# Patient Record
Sex: Female | Born: 1950 | Race: White | Hispanic: No
Health system: Southern US, Community
[De-identification: ages and names within clinical notes are randomized; demographics above are authoritative.]

---

## 2002-08-27 ENCOUNTER — Encounter: Admission: RE | Admit: 2002-08-27 | Discharge: 2002-08-27 | Payer: Self-pay | Admitting: Family Medicine

## 2002-08-27 ENCOUNTER — Encounter: Payer: Self-pay | Admitting: Family Medicine

## 2002-12-25 ENCOUNTER — Ambulatory Visit (HOSPITAL_COMMUNITY): Admission: RE | Admit: 2002-12-25 | Discharge: 2002-12-25 | Payer: Self-pay | Admitting: Gastroenterology

## 2003-11-25 ENCOUNTER — Other Ambulatory Visit: Admission: RE | Admit: 2003-11-25 | Discharge: 2003-11-25 | Payer: Self-pay | Admitting: Family Medicine

## 2004-04-27 ENCOUNTER — Encounter: Admission: RE | Admit: 2004-04-27 | Discharge: 2004-04-27 | Payer: Self-pay | Admitting: Family Medicine

## 2005-02-26 ENCOUNTER — Other Ambulatory Visit: Admission: RE | Admit: 2005-02-26 | Discharge: 2005-02-26 | Payer: Self-pay | Admitting: Family Medicine

## 2005-05-30 ENCOUNTER — Encounter: Admission: RE | Admit: 2005-05-30 | Discharge: 2005-05-30 | Payer: Self-pay | Admitting: Family Medicine

## 2006-04-22 ENCOUNTER — Other Ambulatory Visit: Admission: RE | Admit: 2006-04-22 | Discharge: 2006-04-22 | Payer: Self-pay | Admitting: Family Medicine

## 2006-06-03 ENCOUNTER — Encounter: Admission: RE | Admit: 2006-06-03 | Discharge: 2006-06-03 | Payer: Self-pay | Admitting: Family Medicine

## 2007-04-23 ENCOUNTER — Other Ambulatory Visit: Admission: RE | Admit: 2007-04-23 | Discharge: 2007-04-23 | Payer: Self-pay | Admitting: Family Medicine

## 2007-10-20 ENCOUNTER — Encounter: Admission: RE | Admit: 2007-10-20 | Discharge: 2007-10-20 | Payer: Self-pay | Admitting: Family Medicine

## 2008-11-29 ENCOUNTER — Encounter: Admission: RE | Admit: 2008-11-29 | Discharge: 2008-11-29 | Payer: Self-pay | Admitting: Family Medicine

## 2008-12-22 ENCOUNTER — Other Ambulatory Visit: Admission: RE | Admit: 2008-12-22 | Discharge: 2008-12-22 | Payer: Self-pay | Admitting: Family Medicine

## 2009-12-27 ENCOUNTER — Other Ambulatory Visit
Admission: RE | Admit: 2009-12-27 | Discharge: 2009-12-27 | Payer: Self-pay | Source: Home / Self Care | Admitting: Family Medicine

## 2010-01-05 ENCOUNTER — Encounter: Admission: RE | Admit: 2010-01-05 | Discharge: 2010-01-05 | Payer: Self-pay | Admitting: Family Medicine

## 2011-02-07 ENCOUNTER — Other Ambulatory Visit: Payer: Self-pay | Admitting: Family Medicine

## 2011-02-07 DIAGNOSIS — Z1231 Encounter for screening mammogram for malignant neoplasm of breast: Secondary | ICD-10-CM

## 2011-02-22 ENCOUNTER — Ambulatory Visit
Admission: RE | Admit: 2011-02-22 | Discharge: 2011-02-22 | Disposition: A | Discharge status: Home / Self Care | Payer: BC Managed Care – PPO | Source: Ambulatory Visit | Attending: Family Medicine | Admitting: Family Medicine

## 2011-02-22 DIAGNOSIS — Z1231 Encounter for screening mammogram for malignant neoplasm of breast: Secondary | ICD-10-CM

## 2012-06-17 ENCOUNTER — Other Ambulatory Visit: Payer: Self-pay

## 2012-06-17 DIAGNOSIS — Z1231 Encounter for screening mammogram for malignant neoplasm of breast: Secondary | ICD-10-CM

## 2012-07-18 ENCOUNTER — Ambulatory Visit
Admission: RE | Admit: 2012-07-18 | Discharge: 2012-07-18 | Disposition: A | Discharge status: Home / Self Care | Payer: BC Managed Care – PPO | Source: Ambulatory Visit

## 2012-07-18 DIAGNOSIS — Z1231 Encounter for screening mammogram for malignant neoplasm of breast: Secondary | ICD-10-CM

## 2013-03-16 ENCOUNTER — Other Ambulatory Visit: Payer: Self-pay | Admitting: Family Medicine

## 2013-03-16 ENCOUNTER — Other Ambulatory Visit (HOSPITAL_COMMUNITY)
Admission: RE | Admit: 2013-03-16 | Discharge: 2013-03-16 | Disposition: A | Discharge status: Home / Self Care | Payer: BC Managed Care – PPO | Source: Ambulatory Visit | Attending: Family Medicine | Admitting: Family Medicine

## 2013-03-16 DIAGNOSIS — Z124 Encounter for screening for malignant neoplasm of cervix: Secondary | ICD-10-CM | POA: Insufficient documentation

## 2013-08-24 ENCOUNTER — Other Ambulatory Visit: Payer: Self-pay

## 2013-08-24 DIAGNOSIS — Z1231 Encounter for screening mammogram for malignant neoplasm of breast: Secondary | ICD-10-CM

## 2013-08-28 ENCOUNTER — Ambulatory Visit: Payer: BC Managed Care – PPO

## 2013-09-01 ENCOUNTER — Ambulatory Visit
Admission: RE | Admit: 2013-09-01 | Discharge: 2013-09-01 | Disposition: A | Discharge status: Home / Self Care | Payer: BC Managed Care – PPO | Source: Ambulatory Visit

## 2013-09-01 DIAGNOSIS — Z1231 Encounter for screening mammogram for malignant neoplasm of breast: Secondary | ICD-10-CM

## 2014-08-16 ENCOUNTER — Other Ambulatory Visit: Payer: Self-pay

## 2014-08-16 DIAGNOSIS — Z1231 Encounter for screening mammogram for malignant neoplasm of breast: Secondary | ICD-10-CM

## 2014-09-03 ENCOUNTER — Ambulatory Visit
Admission: RE | Admit: 2014-09-03 | Discharge: 2014-09-03 | Disposition: A | Discharge status: Home / Self Care | Payer: BC Managed Care – PPO | Source: Ambulatory Visit

## 2014-09-03 DIAGNOSIS — Z1231 Encounter for screening mammogram for malignant neoplasm of breast: Secondary | ICD-10-CM

## 2015-09-26 ENCOUNTER — Other Ambulatory Visit: Payer: Self-pay | Admitting: Family Medicine

## 2015-09-26 DIAGNOSIS — Z1231 Encounter for screening mammogram for malignant neoplasm of breast: Secondary | ICD-10-CM

## 2015-09-30 ENCOUNTER — Ambulatory Visit
Admission: RE | Admit: 2015-09-30 | Discharge: 2015-09-30 | Disposition: A | Discharge status: Home / Self Care | Payer: BC Managed Care – PPO | Source: Ambulatory Visit | Attending: Family Medicine | Admitting: Family Medicine

## 2015-09-30 DIAGNOSIS — Z1231 Encounter for screening mammogram for malignant neoplasm of breast: Secondary | ICD-10-CM

## 2016-11-02 ENCOUNTER — Other Ambulatory Visit: Payer: Self-pay | Admitting: Family Medicine

## 2016-11-02 ENCOUNTER — Other Ambulatory Visit (HOSPITAL_COMMUNITY)
Admission: RE | Admit: 2016-11-02 | Discharge: 2016-11-02 | Disposition: A | Discharge status: Home / Self Care | Payer: BC Managed Care – PPO | Source: Ambulatory Visit | Attending: Family Medicine | Admitting: Family Medicine

## 2016-11-02 DIAGNOSIS — Z124 Encounter for screening for malignant neoplasm of cervix: Secondary | ICD-10-CM | POA: Diagnosis not present

## 2016-11-06 LAB — CYTOLOGY - PAP
Diagnosis: NEGATIVE
HPV: NOT DETECTED

## 2016-11-20 ENCOUNTER — Other Ambulatory Visit: Payer: Self-pay | Admitting: Family Medicine

## 2016-11-20 DIAGNOSIS — Z1231 Encounter for screening mammogram for malignant neoplasm of breast: Secondary | ICD-10-CM

## 2016-12-17 ENCOUNTER — Ambulatory Visit
Admission: RE | Admit: 2016-12-17 | Discharge: 2016-12-17 | Disposition: A | Discharge status: Home / Self Care | Payer: BC Managed Care – PPO | Source: Ambulatory Visit | Attending: Family Medicine | Admitting: Family Medicine

## 2016-12-17 ENCOUNTER — Encounter (INDEPENDENT_AMBULATORY_CARE_PROVIDER_SITE_OTHER): Payer: Self-pay

## 2016-12-17 DIAGNOSIS — Z1231 Encounter for screening mammogram for malignant neoplasm of breast: Secondary | ICD-10-CM

## 2018-03-07 ENCOUNTER — Other Ambulatory Visit: Payer: Self-pay | Admitting: Family Medicine

## 2018-03-07 DIAGNOSIS — M810 Age-related osteoporosis without current pathological fracture: Secondary | ICD-10-CM

## 2018-03-10 ENCOUNTER — Other Ambulatory Visit: Payer: Self-pay | Admitting: Family Medicine

## 2018-03-10 DIAGNOSIS — M810 Age-related osteoporosis without current pathological fracture: Secondary | ICD-10-CM

## 2018-03-25 ENCOUNTER — Other Ambulatory Visit: Payer: Self-pay | Admitting: Family Medicine

## 2018-03-25 DIAGNOSIS — Z1231 Encounter for screening mammogram for malignant neoplasm of breast: Secondary | ICD-10-CM

## 2018-03-28 ENCOUNTER — Ambulatory Visit
Admission: RE | Admit: 2018-03-28 | Discharge: 2018-03-28 | Disposition: A | Discharge status: Home / Self Care | Payer: BC Managed Care – PPO | Source: Ambulatory Visit

## 2018-03-28 DIAGNOSIS — Z1231 Encounter for screening mammogram for malignant neoplasm of breast: Secondary | ICD-10-CM

## 2019-01-21 ENCOUNTER — Ambulatory Visit
Admission: RE | Admit: 2019-01-21 | Discharge: 2019-01-21 | Disposition: A | Discharge status: Home / Self Care | Payer: Medicare Other | Source: Ambulatory Visit | Attending: Family Medicine | Admitting: Family Medicine

## 2019-01-21 ENCOUNTER — Other Ambulatory Visit: Payer: Self-pay

## 2019-01-21 DIAGNOSIS — M810 Age-related osteoporosis without current pathological fracture: Secondary | ICD-10-CM

## 2019-03-07 ENCOUNTER — Ambulatory Visit: Payer: BC Managed Care – PPO

## 2019-03-12 ENCOUNTER — Ambulatory Visit: Payer: BC Managed Care – PPO

## 2019-03-15 ENCOUNTER — Ambulatory Visit: Payer: Medicare PPO | Attending: Internal Medicine

## 2019-03-15 DIAGNOSIS — Z23 Encounter for immunization: Secondary | ICD-10-CM | POA: Insufficient documentation

## 2019-03-15 NOTE — Progress Notes (Signed)
   Covid-19 Vaccination Clinic  Name:  Lynn Mcknight    MRN: [id number] DOB: 08/18/1950  03/15/2019  [name] was observed post Covid-19 immunization for 15 minutes without incidence. She was provided with Vaccine Information Sheet and instruction to access the V-Safe system.   [name] was instructed to call 911 with any severe reactions post vaccine: Marland Kitchen Difficulty breathing  . Swelling of your face and throat  . A fast heartbeat  . A bad rash all over your body  . Dizziness and weakness    Immunizations Administered    Name Date Dose VIS Date Route   Pfizer COVID-19 Vaccine 03/15/2019  8:29 AM 0.3 mL 01/16/2019 Intramuscular   Manufacturer: [employer], [employer]   Lot: [id number]   NDC: [address]-6

## 2019-04-08 ENCOUNTER — Ambulatory Visit: Payer: Medicare PPO | Attending: Internal Medicine

## 2019-04-08 DIAGNOSIS — Z23 Encounter for immunization: Secondary | ICD-10-CM

## 2019-04-08 NOTE — Progress Notes (Signed)
   Covid-19 Vaccination Clinic  Name:  Lynn Mcknight    MRN: [id number] DOB: Aug 10, 1950  04/08/2019  [name] was observed post Covid-19 immunization for 15 minutes without incident. She was provided with Vaccine Information Sheet and instruction to access the V-Safe system.   [name] was instructed to call 911 with any severe reactions post vaccine: Marland Kitchen Difficulty breathing  . Swelling of face and throat  . A fast heartbeat  . A bad rash all over body  . Dizziness and weakness   Immunizations Administered    Name Date Dose VIS Date Route   Pfizer COVID-19 Vaccine 04/08/2019  3:34 PM 0.3 mL 01/16/2019 Intramuscular   Manufacturer: [employer], [employer]   Lot: [id number]   NDC: [address]-7

## 2019-05-07 DIAGNOSIS — F4323 Adjustment disorder with mixed anxiety and depressed mood: Secondary | ICD-10-CM | POA: Diagnosis not present

## 2019-05-19 DIAGNOSIS — F4323 Adjustment disorder with mixed anxiety and depressed mood: Secondary | ICD-10-CM | POA: Diagnosis not present

## 2019-05-25 ENCOUNTER — Other Ambulatory Visit: Payer: Self-pay | Admitting: Family Medicine

## 2019-05-25 DIAGNOSIS — Z1231 Encounter for screening mammogram for malignant neoplasm of breast: Secondary | ICD-10-CM

## 2019-05-27 ENCOUNTER — Ambulatory Visit
Admission: RE | Admit: 2019-05-27 | Discharge: 2019-05-27 | Disposition: A | Discharge status: Home / Self Care | Payer: Medicare PPO | Source: Ambulatory Visit

## 2019-05-27 ENCOUNTER — Other Ambulatory Visit: Payer: Self-pay

## 2019-05-27 DIAGNOSIS — Z1231 Encounter for screening mammogram for malignant neoplasm of breast: Secondary | ICD-10-CM | POA: Diagnosis not present

## 2019-06-08 DIAGNOSIS — M810 Age-related osteoporosis without current pathological fracture: Secondary | ICD-10-CM | POA: Diagnosis not present

## 2019-06-08 DIAGNOSIS — F4323 Adjustment disorder with mixed anxiety and depressed mood: Secondary | ICD-10-CM | POA: Diagnosis not present

## 2019-06-08 DIAGNOSIS — Z23 Encounter for immunization: Secondary | ICD-10-CM | POA: Diagnosis not present

## 2019-06-22 DIAGNOSIS — F4323 Adjustment disorder with mixed anxiety and depressed mood: Secondary | ICD-10-CM | POA: Diagnosis not present

## 2019-07-09 DIAGNOSIS — F4323 Adjustment disorder with mixed anxiety and depressed mood: Secondary | ICD-10-CM | POA: Diagnosis not present

## 2019-07-15 DIAGNOSIS — F4323 Adjustment disorder with mixed anxiety and depressed mood: Secondary | ICD-10-CM | POA: Diagnosis not present

## 2019-07-29 DIAGNOSIS — K648 Other hemorrhoids: Secondary | ICD-10-CM | POA: Diagnosis not present

## 2019-08-06 DIAGNOSIS — F4323 Adjustment disorder with mixed anxiety and depressed mood: Secondary | ICD-10-CM | POA: Diagnosis not present

## 2019-08-12 DIAGNOSIS — K648 Other hemorrhoids: Secondary | ICD-10-CM | POA: Diagnosis not present

## 2019-08-13 DIAGNOSIS — F4323 Adjustment disorder with mixed anxiety and depressed mood: Secondary | ICD-10-CM | POA: Diagnosis not present

## 2019-08-20 DIAGNOSIS — F4323 Adjustment disorder with mixed anxiety and depressed mood: Secondary | ICD-10-CM | POA: Diagnosis not present

## 2019-08-26 DIAGNOSIS — K648 Other hemorrhoids: Secondary | ICD-10-CM | POA: Diagnosis not present

## 2019-12-10 DIAGNOSIS — M810 Age-related osteoporosis without current pathological fracture: Secondary | ICD-10-CM | POA: Diagnosis not present

## 2020-01-06 DIAGNOSIS — F4323 Adjustment disorder with mixed anxiety and depressed mood: Secondary | ICD-10-CM | POA: Diagnosis not present

## 2020-01-12 DIAGNOSIS — H5203 Hypermetropia, bilateral: Secondary | ICD-10-CM | POA: Diagnosis not present

## 2020-01-12 DIAGNOSIS — H2513 Age-related nuclear cataract, bilateral: Secondary | ICD-10-CM | POA: Diagnosis not present

## 2020-01-12 DIAGNOSIS — H52223 Regular astigmatism, bilateral: Secondary | ICD-10-CM | POA: Diagnosis not present

## 2020-01-12 DIAGNOSIS — H524 Presbyopia: Secondary | ICD-10-CM | POA: Diagnosis not present

## 2020-01-18 DIAGNOSIS — F4323 Adjustment disorder with mixed anxiety and depressed mood: Secondary | ICD-10-CM | POA: Diagnosis not present

## 2020-02-08 DIAGNOSIS — F4323 Adjustment disorder with mixed anxiety and depressed mood: Secondary | ICD-10-CM | POA: Diagnosis not present

## 2020-02-15 DIAGNOSIS — F4323 Adjustment disorder with mixed anxiety and depressed mood: Secondary | ICD-10-CM | POA: Diagnosis not present

## 2020-02-17 DIAGNOSIS — F39 Unspecified mood [affective] disorder: Secondary | ICD-10-CM | POA: Diagnosis not present

## 2020-02-17 DIAGNOSIS — F418 Other specified anxiety disorders: Secondary | ICD-10-CM | POA: Diagnosis not present

## 2020-02-25 DIAGNOSIS — F4323 Adjustment disorder with mixed anxiety and depressed mood: Secondary | ICD-10-CM | POA: Diagnosis not present

## 2020-02-29 DIAGNOSIS — F4323 Adjustment disorder with mixed anxiety and depressed mood: Secondary | ICD-10-CM | POA: Diagnosis not present

## 2020-03-08 DIAGNOSIS — F4323 Adjustment disorder with mixed anxiety and depressed mood: Secondary | ICD-10-CM | POA: Diagnosis not present

## 2020-03-10 ENCOUNTER — Other Ambulatory Visit: Payer: Self-pay | Admitting: Family Medicine

## 2020-03-10 DIAGNOSIS — Z0000 Encounter for general adult medical examination without abnormal findings: Secondary | ICD-10-CM | POA: Diagnosis not present

## 2020-03-10 DIAGNOSIS — E559 Vitamin D deficiency, unspecified: Secondary | ICD-10-CM | POA: Diagnosis not present

## 2020-03-10 DIAGNOSIS — K219 Gastro-esophageal reflux disease without esophagitis: Secondary | ICD-10-CM | POA: Diagnosis not present

## 2020-03-10 DIAGNOSIS — F39 Unspecified mood [affective] disorder: Secondary | ICD-10-CM | POA: Diagnosis not present

## 2020-03-10 DIAGNOSIS — M810 Age-related osteoporosis without current pathological fracture: Secondary | ICD-10-CM | POA: Diagnosis not present

## 2020-03-10 DIAGNOSIS — F418 Other specified anxiety disorders: Secondary | ICD-10-CM | POA: Diagnosis not present

## 2020-03-10 DIAGNOSIS — E785 Hyperlipidemia, unspecified: Secondary | ICD-10-CM | POA: Diagnosis not present

## 2020-03-10 DIAGNOSIS — M1990 Unspecified osteoarthritis, unspecified site: Secondary | ICD-10-CM | POA: Diagnosis not present

## 2020-04-06 DIAGNOSIS — F4323 Adjustment disorder with mixed anxiety and depressed mood: Secondary | ICD-10-CM | POA: Diagnosis not present

## 2020-04-18 DIAGNOSIS — F4323 Adjustment disorder with mixed anxiety and depressed mood: Secondary | ICD-10-CM | POA: Diagnosis not present

## 2020-04-27 DIAGNOSIS — F4323 Adjustment disorder with mixed anxiety and depressed mood: Secondary | ICD-10-CM | POA: Diagnosis not present

## 2020-05-04 DIAGNOSIS — F4323 Adjustment disorder with mixed anxiety and depressed mood: Secondary | ICD-10-CM | POA: Diagnosis not present

## 2020-06-13 DIAGNOSIS — M810 Age-related osteoporosis without current pathological fracture: Secondary | ICD-10-CM | POA: Diagnosis not present

## 2020-06-15 DIAGNOSIS — F4323 Adjustment disorder with mixed anxiety and depressed mood: Secondary | ICD-10-CM | POA: Diagnosis not present

## 2020-06-22 DIAGNOSIS — F4323 Adjustment disorder with mixed anxiety and depressed mood: Secondary | ICD-10-CM | POA: Diagnosis not present

## 2020-06-29 DIAGNOSIS — F4323 Adjustment disorder with mixed anxiety and depressed mood: Secondary | ICD-10-CM | POA: Diagnosis not present

## 2020-07-06 DIAGNOSIS — F4323 Adjustment disorder with mixed anxiety and depressed mood: Secondary | ICD-10-CM | POA: Diagnosis not present

## 2020-07-11 ENCOUNTER — Other Ambulatory Visit (HOSPITAL_BASED_OUTPATIENT_CLINIC_OR_DEPARTMENT_OTHER): Payer: Self-pay | Admitting: Family Medicine

## 2020-07-11 DIAGNOSIS — Z1231 Encounter for screening mammogram for malignant neoplasm of breast: Secondary | ICD-10-CM

## 2020-07-12 ENCOUNTER — Ambulatory Visit (HOSPITAL_BASED_OUTPATIENT_CLINIC_OR_DEPARTMENT_OTHER)
Admission: RE | Admit: 2020-07-12 | Discharge: 2020-07-12 | Disposition: A | Discharge status: Home / Self Care | Payer: Medicare PPO | Source: Ambulatory Visit | Attending: Family Medicine | Admitting: Family Medicine

## 2020-07-12 ENCOUNTER — Encounter (HOSPITAL_BASED_OUTPATIENT_CLINIC_OR_DEPARTMENT_OTHER): Payer: Self-pay

## 2020-07-12 ENCOUNTER — Other Ambulatory Visit: Payer: Self-pay

## 2020-07-12 DIAGNOSIS — Z1231 Encounter for screening mammogram for malignant neoplasm of breast: Secondary | ICD-10-CM | POA: Diagnosis not present

## 2020-07-13 DIAGNOSIS — F4323 Adjustment disorder with mixed anxiety and depressed mood: Secondary | ICD-10-CM | POA: Diagnosis not present

## 2020-07-20 DIAGNOSIS — F4323 Adjustment disorder with mixed anxiety and depressed mood: Secondary | ICD-10-CM | POA: Diagnosis not present

## 2020-08-17 DIAGNOSIS — F4323 Adjustment disorder with mixed anxiety and depressed mood: Secondary | ICD-10-CM | POA: Diagnosis not present

## 2020-10-18 DIAGNOSIS — R059 Cough, unspecified: Secondary | ICD-10-CM | POA: Diagnosis not present

## 2020-10-18 DIAGNOSIS — U071 COVID-19: Secondary | ICD-10-CM | POA: Diagnosis not present

## 2020-10-18 DIAGNOSIS — R0981 Nasal congestion: Secondary | ICD-10-CM | POA: Diagnosis not present

## 2020-12-15 DIAGNOSIS — M810 Age-related osteoporosis without current pathological fracture: Secondary | ICD-10-CM | POA: Diagnosis not present

## 2021-01-23 ENCOUNTER — Other Ambulatory Visit: Payer: Self-pay | Admitting: Family Medicine

## 2021-01-23 ENCOUNTER — Other Ambulatory Visit: Payer: Medicare PPO

## 2021-01-23 DIAGNOSIS — M810 Age-related osteoporosis without current pathological fracture: Secondary | ICD-10-CM

## 2021-06-19 ENCOUNTER — Other Ambulatory Visit: Payer: Medicare PPO

## 2021-07-20 ENCOUNTER — Other Ambulatory Visit: Payer: Self-pay | Admitting: Family Medicine

## 2021-07-20 DIAGNOSIS — F39 Unspecified mood [affective] disorder: Secondary | ICD-10-CM | POA: Diagnosis not present

## 2021-07-20 DIAGNOSIS — M810 Age-related osteoporosis without current pathological fracture: Secondary | ICD-10-CM | POA: Diagnosis not present

## 2021-07-20 DIAGNOSIS — Z0000 Encounter for general adult medical examination without abnormal findings: Secondary | ICD-10-CM | POA: Diagnosis not present

## 2021-07-20 DIAGNOSIS — N951 Menopausal and female climacteric states: Secondary | ICD-10-CM | POA: Diagnosis not present

## 2021-07-20 DIAGNOSIS — Z1331 Encounter for screening for depression: Secondary | ICD-10-CM | POA: Diagnosis not present

## 2021-07-20 DIAGNOSIS — Z1231 Encounter for screening mammogram for malignant neoplasm of breast: Secondary | ICD-10-CM

## 2021-07-20 DIAGNOSIS — M1990 Unspecified osteoarthritis, unspecified site: Secondary | ICD-10-CM | POA: Diagnosis not present

## 2021-07-20 DIAGNOSIS — E785 Hyperlipidemia, unspecified: Secondary | ICD-10-CM | POA: Diagnosis not present

## 2021-07-20 DIAGNOSIS — E559 Vitamin D deficiency, unspecified: Secondary | ICD-10-CM | POA: Diagnosis not present

## 2021-07-20 DIAGNOSIS — K219 Gastro-esophageal reflux disease without esophagitis: Secondary | ICD-10-CM | POA: Diagnosis not present

## 2021-08-29 ENCOUNTER — Ambulatory Visit
Admission: RE | Admit: 2021-08-29 | Discharge: 2021-08-29 | Disposition: A | Discharge status: Home / Self Care | Payer: Medicare PPO | Source: Ambulatory Visit | Attending: Family Medicine | Admitting: Family Medicine

## 2021-08-29 DIAGNOSIS — Z780 Asymptomatic menopausal state: Secondary | ICD-10-CM | POA: Diagnosis not present

## 2021-08-29 DIAGNOSIS — Z1231 Encounter for screening mammogram for malignant neoplasm of breast: Secondary | ICD-10-CM

## 2021-08-29 DIAGNOSIS — M810 Age-related osteoporosis without current pathological fracture: Secondary | ICD-10-CM

## 2021-08-29 DIAGNOSIS — M8589 Other specified disorders of bone density and structure, multiple sites: Secondary | ICD-10-CM | POA: Diagnosis not present

## 2021-12-05 IMAGING — MG MM DIGITAL SCREENING BILAT W/ TOMO AND CAD
8 series · 9 of 24 positions shown · non-contrast
Comparison: Previous exam(s).

CLINICAL DATA: Screening.

EXAM:
DIGITAL SCREENING BILATERAL MAMMOGRAM WITH TOMOSYNTHESIS AND CAD
TECHNIQUE: Bilateral screening digital craniocaudal and mediolateral oblique
mammograms were obtained. Bilateral screening digital breast
tomosynthesis was performed. The images were evaluated with
computer-aided detection.

[L MLO synth-2D]
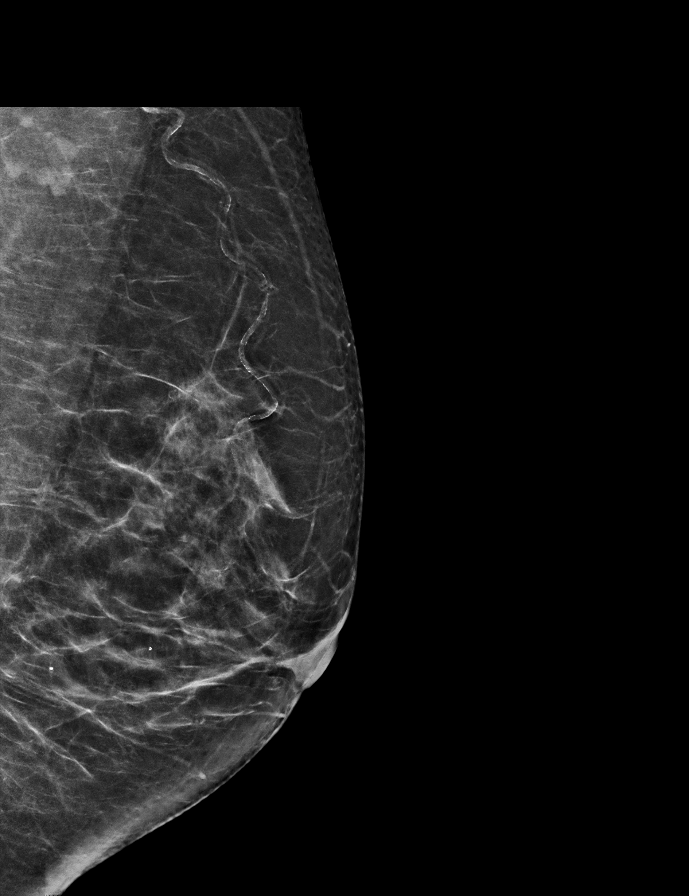

[L CC synth-2D]
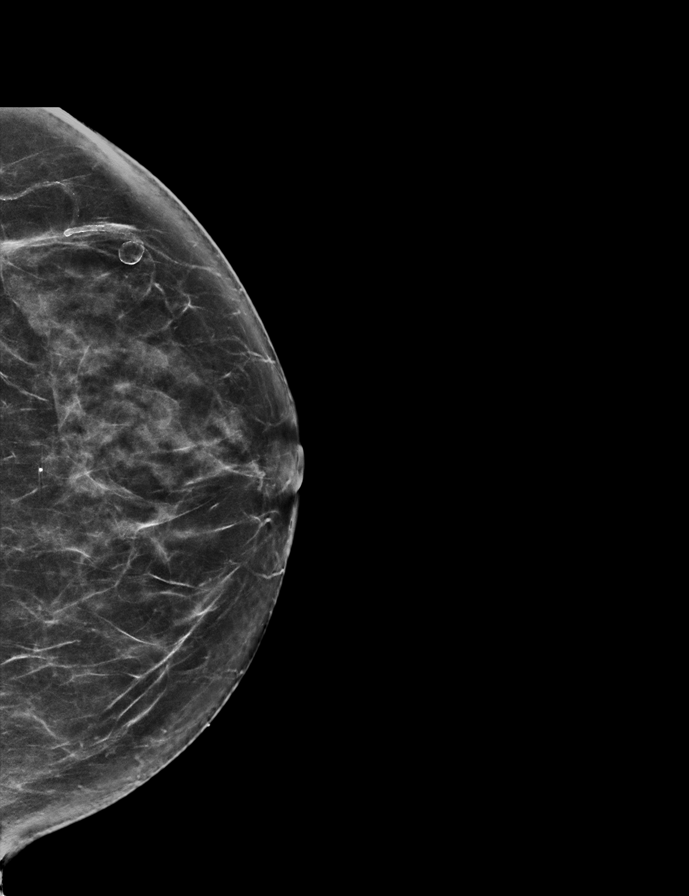

[R MLO synth-2D]
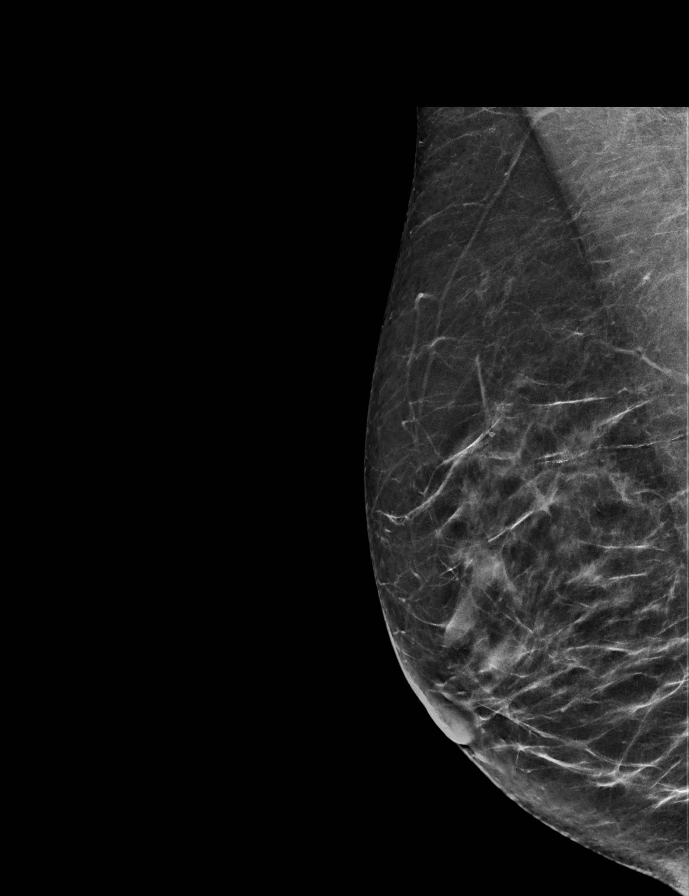

[R CC synth-2D]
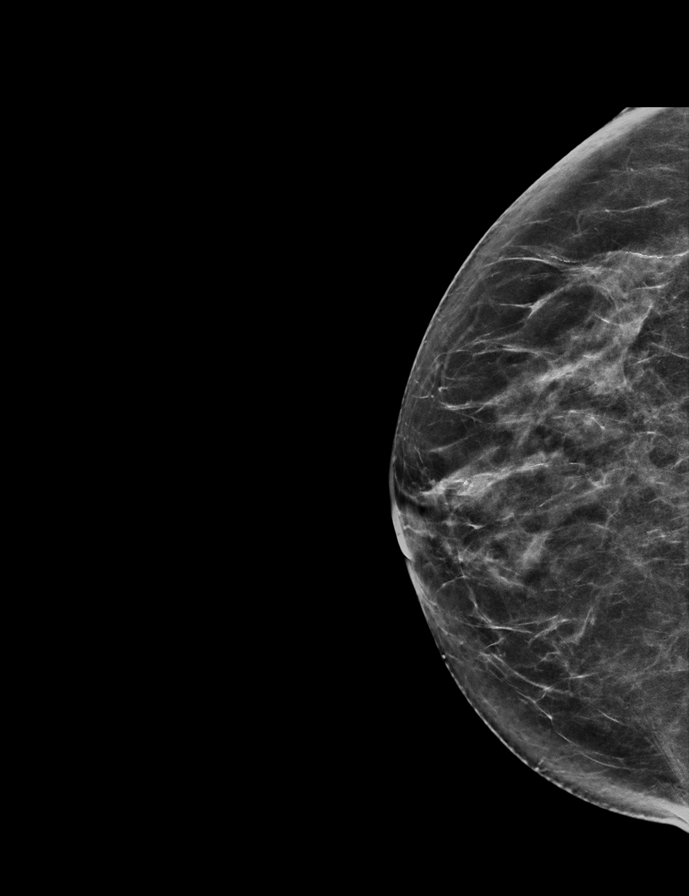

[R CC tomo · 2 of 63 frames shown]
[frame 21/63]
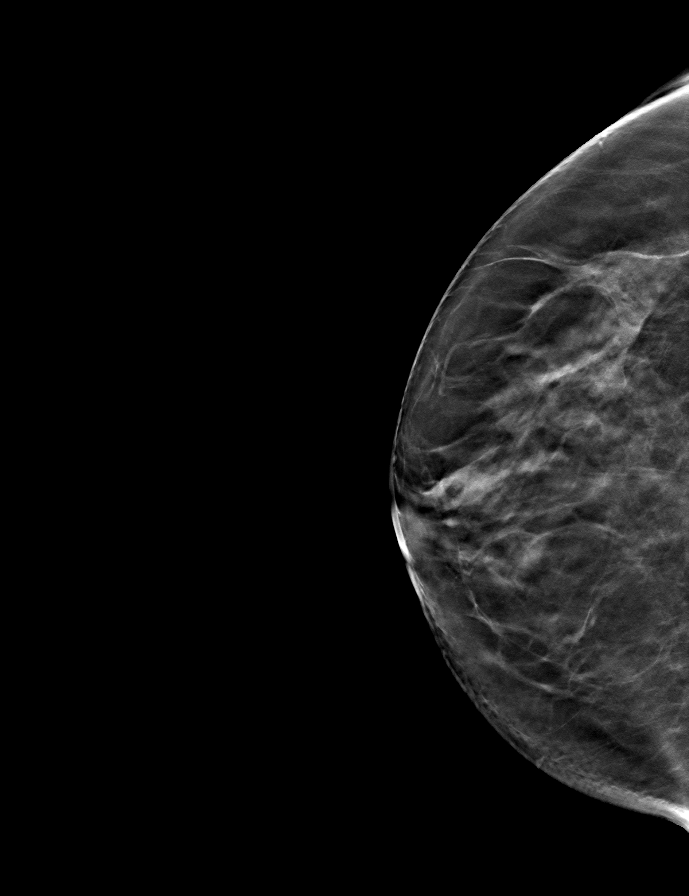
[frame 32/63]
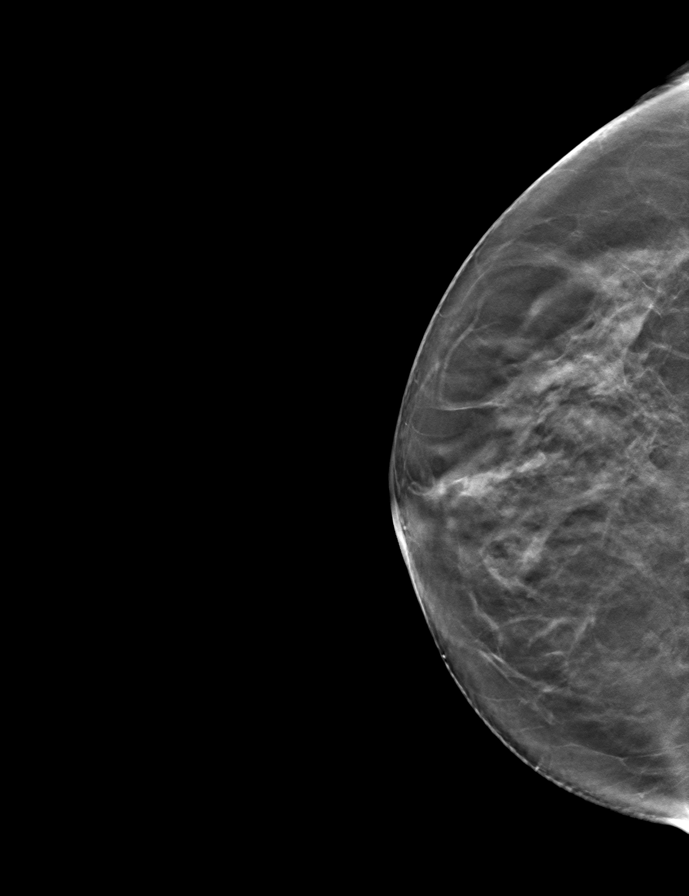

[L CC tomo · tomo slice 35/70.0]
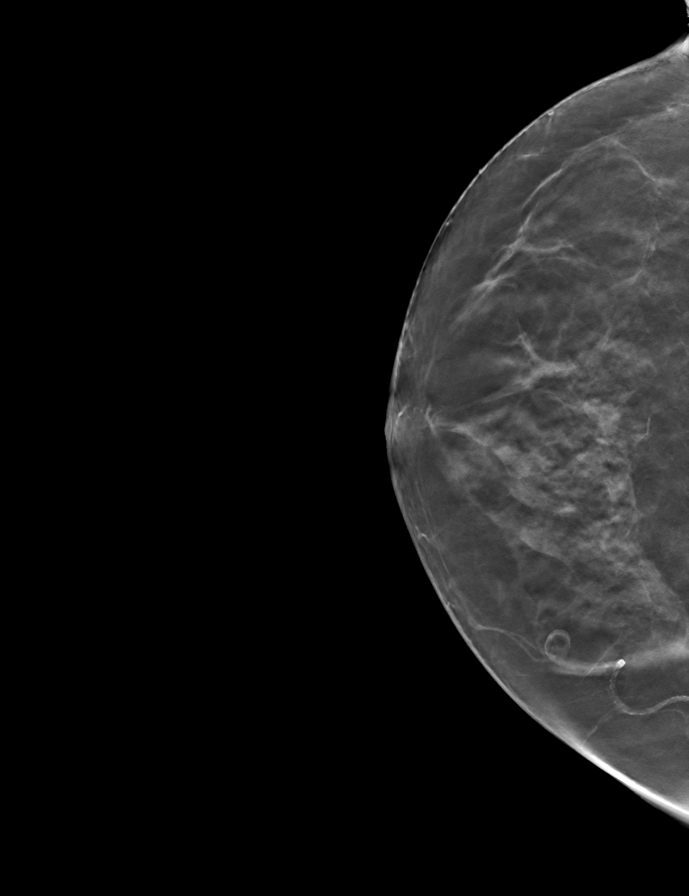

[R MLO tomo · tomo slice 31/62.0]
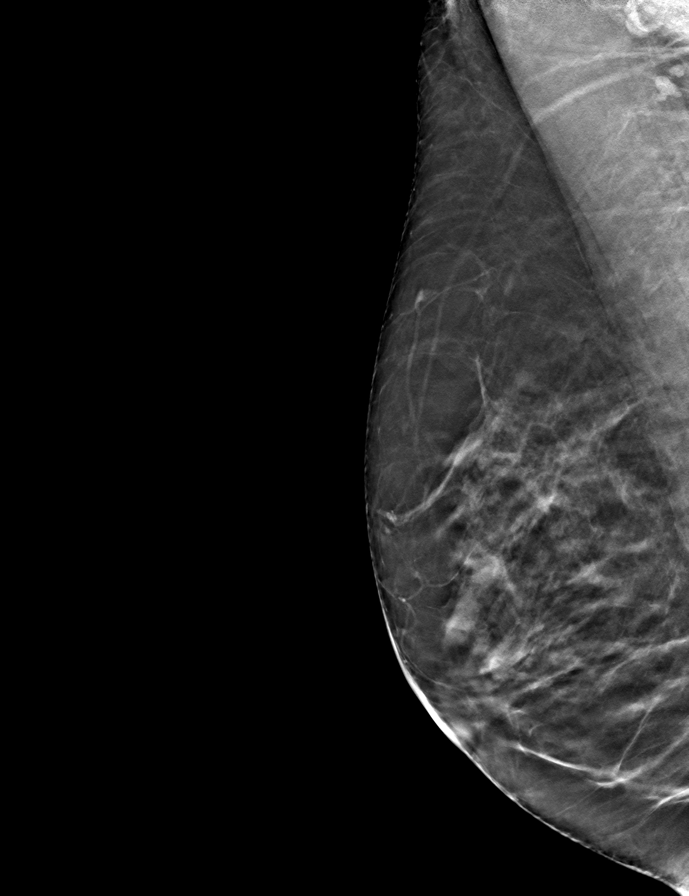

[L MLO tomo · tomo slice 32/63.0]
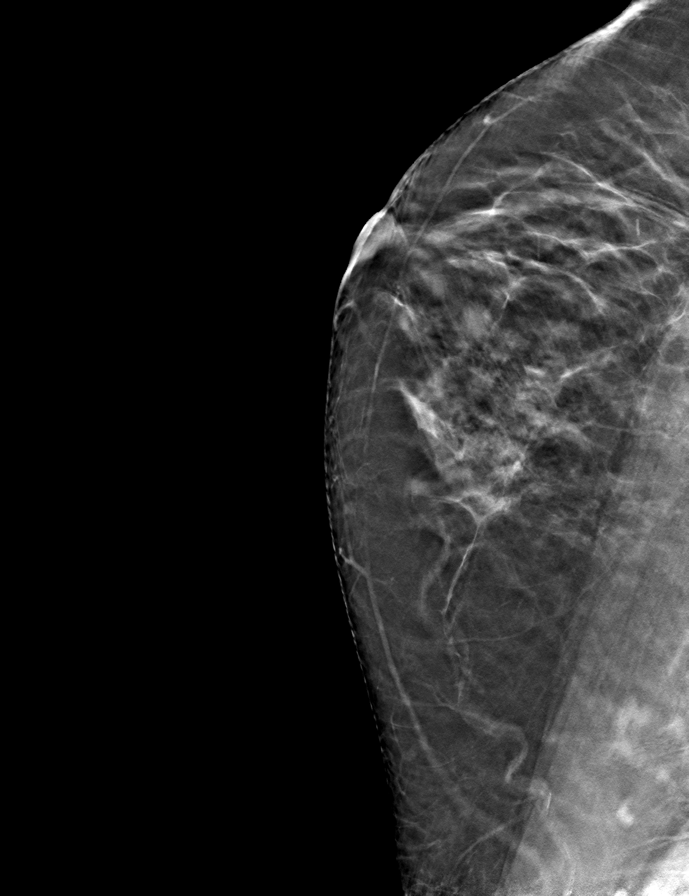

[9 of 24 positions shown; findings below may reference images not displayed]

ACR Breast Density Category c: The breast tissue is heterogeneously
dense, which may obscure small masses.
FINDINGS: There are no findings suspicious for malignancy. The images were
evaluated with computer-aided detection.
IMPRESSION: No mammographic evidence of malignancy. A result letter of this
screening mammogram will be mailed directly to the patient.

RECOMMENDATION:
Screening mammogram in one year. (Code:T4-5-GWO)

BI-RADS CATEGORY  1: Negative.

## 2022-01-22 DIAGNOSIS — M810 Age-related osteoporosis without current pathological fracture: Secondary | ICD-10-CM | POA: Diagnosis not present

## 2022-01-22 DIAGNOSIS — F411 Generalized anxiety disorder: Secondary | ICD-10-CM | POA: Diagnosis not present

## 2022-01-22 DIAGNOSIS — F418 Other specified anxiety disorders: Secondary | ICD-10-CM | POA: Diagnosis not present

## 2022-01-22 DIAGNOSIS — E559 Vitamin D deficiency, unspecified: Secondary | ICD-10-CM | POA: Diagnosis not present

## 2022-01-22 DIAGNOSIS — M1990 Unspecified osteoarthritis, unspecified site: Secondary | ICD-10-CM | POA: Diagnosis not present

## 2022-01-22 DIAGNOSIS — E785 Hyperlipidemia, unspecified: Secondary | ICD-10-CM | POA: Diagnosis not present

## 2022-07-30 DIAGNOSIS — Z0000 Encounter for general adult medical examination without abnormal findings: Secondary | ICD-10-CM | POA: Diagnosis not present

## 2022-07-30 DIAGNOSIS — F418 Other specified anxiety disorders: Secondary | ICD-10-CM | POA: Diagnosis not present

## 2022-07-30 DIAGNOSIS — E559 Vitamin D deficiency, unspecified: Secondary | ICD-10-CM | POA: Diagnosis not present

## 2022-07-30 DIAGNOSIS — K219 Gastro-esophageal reflux disease without esophagitis: Secondary | ICD-10-CM | POA: Diagnosis not present

## 2022-07-30 DIAGNOSIS — M810 Age-related osteoporosis without current pathological fracture: Secondary | ICD-10-CM | POA: Diagnosis not present

## 2022-07-30 DIAGNOSIS — F411 Generalized anxiety disorder: Secondary | ICD-10-CM | POA: Diagnosis not present

## 2022-07-30 DIAGNOSIS — Z23 Encounter for immunization: Secondary | ICD-10-CM | POA: Diagnosis not present

## 2022-07-30 DIAGNOSIS — M1990 Unspecified osteoarthritis, unspecified site: Secondary | ICD-10-CM | POA: Diagnosis not present

## 2022-07-30 DIAGNOSIS — E785 Hyperlipidemia, unspecified: Secondary | ICD-10-CM | POA: Diagnosis not present

## 2022-10-03 DIAGNOSIS — L821 Other seborrheic keratosis: Secondary | ICD-10-CM | POA: Diagnosis not present

## 2022-10-03 DIAGNOSIS — D225 Melanocytic nevi of trunk: Secondary | ICD-10-CM | POA: Diagnosis not present

## 2022-10-03 DIAGNOSIS — L814 Other melanin hyperpigmentation: Secondary | ICD-10-CM | POA: Diagnosis not present

## 2022-10-04 ENCOUNTER — Other Ambulatory Visit: Payer: Self-pay | Admitting: Family Medicine

## 2022-10-04 DIAGNOSIS — Z1231 Encounter for screening mammogram for malignant neoplasm of breast: Secondary | ICD-10-CM

## 2022-10-10 DIAGNOSIS — H53143 Visual discomfort, bilateral: Secondary | ICD-10-CM | POA: Diagnosis not present

## 2022-10-10 DIAGNOSIS — H5203 Hypermetropia, bilateral: Secondary | ICD-10-CM | POA: Diagnosis not present

## 2022-10-10 DIAGNOSIS — H2513 Age-related nuclear cataract, bilateral: Secondary | ICD-10-CM | POA: Diagnosis not present

## 2022-10-16 DIAGNOSIS — Z1231 Encounter for screening mammogram for malignant neoplasm of breast: Secondary | ICD-10-CM

## 2022-10-18 ENCOUNTER — Ambulatory Visit
Admission: RE | Admit: 2022-10-18 | Discharge: 2022-10-18 | Disposition: A | Discharge status: Home / Self Care | Payer: Medicare PPO | Source: Ambulatory Visit | Attending: Family Medicine | Admitting: Family Medicine

## 2022-10-18 DIAGNOSIS — Z1231 Encounter for screening mammogram for malignant neoplasm of breast: Secondary | ICD-10-CM | POA: Diagnosis not present

## 2022-10-20 DIAGNOSIS — Z23 Encounter for immunization: Secondary | ICD-10-CM | POA: Diagnosis not present

## 2022-12-24 ENCOUNTER — Other Ambulatory Visit: Payer: Self-pay | Admitting: Medical Genetics

## 2022-12-24 DIAGNOSIS — Z006 Encounter for examination for normal comparison and control in clinical research program: Secondary | ICD-10-CM

## 2023-01-24 ENCOUNTER — Other Ambulatory Visit (HOSPITAL_COMMUNITY)
Admission: RE | Admit: 2023-01-24 | Discharge: 2023-01-24 | Disposition: A | Discharge status: Home / Self Care | Payer: Self-pay | Source: Ambulatory Visit | Attending: Medical Genetics | Admitting: Medical Genetics

## 2023-01-24 DIAGNOSIS — Z006 Encounter for examination for normal comparison and control in clinical research program: Secondary | ICD-10-CM | POA: Insufficient documentation

## 2023-02-04 LAB — GENECONNECT MOLECULAR SCREEN: Genetic Analysis Overall Interpretation: NEGATIVE

## 2023-02-08 DIAGNOSIS — K219 Gastro-esophageal reflux disease without esophagitis: Secondary | ICD-10-CM | POA: Diagnosis not present

## 2023-02-08 DIAGNOSIS — E559 Vitamin D deficiency, unspecified: Secondary | ICD-10-CM | POA: Diagnosis not present

## 2023-02-08 DIAGNOSIS — E785 Hyperlipidemia, unspecified: Secondary | ICD-10-CM | POA: Diagnosis not present

## 2023-02-11 DIAGNOSIS — E559 Vitamin D deficiency, unspecified: Secondary | ICD-10-CM | POA: Diagnosis not present

## 2023-02-11 DIAGNOSIS — F418 Other specified anxiety disorders: Secondary | ICD-10-CM | POA: Diagnosis not present

## 2023-02-11 DIAGNOSIS — M810 Age-related osteoporosis without current pathological fracture: Secondary | ICD-10-CM | POA: Diagnosis not present

## 2023-02-11 DIAGNOSIS — M1990 Unspecified osteoarthritis, unspecified site: Secondary | ICD-10-CM | POA: Diagnosis not present

## 2023-02-11 DIAGNOSIS — K219 Gastro-esophageal reflux disease without esophagitis: Secondary | ICD-10-CM | POA: Diagnosis not present

## 2023-02-11 DIAGNOSIS — E785 Hyperlipidemia, unspecified: Secondary | ICD-10-CM | POA: Diagnosis not present

## 2023-02-11 DIAGNOSIS — N951 Menopausal and female climacteric states: Secondary | ICD-10-CM | POA: Diagnosis not present

## 2023-02-11 DIAGNOSIS — Z23 Encounter for immunization: Secondary | ICD-10-CM | POA: Diagnosis not present

## 2023-09-18 DIAGNOSIS — E785 Hyperlipidemia, unspecified: Secondary | ICD-10-CM | POA: Diagnosis not present

## 2023-09-18 DIAGNOSIS — Z0000 Encounter for general adult medical examination without abnormal findings: Secondary | ICD-10-CM | POA: Diagnosis not present

## 2023-09-18 DIAGNOSIS — F411 Generalized anxiety disorder: Secondary | ICD-10-CM | POA: Diagnosis not present

## 2023-09-18 DIAGNOSIS — E559 Vitamin D deficiency, unspecified: Secondary | ICD-10-CM | POA: Diagnosis not present

## 2023-09-18 DIAGNOSIS — M810 Age-related osteoporosis without current pathological fracture: Secondary | ICD-10-CM | POA: Diagnosis not present

## 2023-09-18 DIAGNOSIS — R7309 Other abnormal glucose: Secondary | ICD-10-CM | POA: Diagnosis not present

## 2023-09-18 DIAGNOSIS — K219 Gastro-esophageal reflux disease without esophagitis: Secondary | ICD-10-CM | POA: Diagnosis not present

## 2023-12-19 DIAGNOSIS — Z1331 Encounter for screening for depression: Secondary | ICD-10-CM | POA: Diagnosis not present

## 2023-12-19 DIAGNOSIS — F419 Anxiety disorder, unspecified: Secondary | ICD-10-CM | POA: Diagnosis not present

## 2023-12-19 DIAGNOSIS — M810 Age-related osteoporosis without current pathological fracture: Secondary | ICD-10-CM | POA: Diagnosis not present

## 2023-12-19 DIAGNOSIS — E785 Hyperlipidemia, unspecified: Secondary | ICD-10-CM | POA: Diagnosis not present

## 2023-12-31 DIAGNOSIS — Z1231 Encounter for screening mammogram for malignant neoplasm of breast: Secondary | ICD-10-CM | POA: Diagnosis not present
